# Patient Record
Sex: Female | Born: 2000 | Race: White | Hispanic: No | Marital: Single
Health system: Midwestern US, Academic
[De-identification: ages and names within clinical notes are randomized; demographics above are authoritative.]

---
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
## 2024-03-12 ENCOUNTER — Encounter: Admit: 2024-03-12 | Discharge: 2024-03-12 | Payer: PRIVATE HEALTH INSURANCE
